# Patient Record
Sex: Female | Born: 1955
Health system: Southern US, Community
[De-identification: ages and names within clinical notes are randomized; demographics above are authoritative.]

## PROBLEM LIST (undated history)

## (undated) DIAGNOSIS — Z800 Family history of malignant neoplasm of digestive organs: Secondary | ICD-10-CM

## (undated) DIAGNOSIS — C801 Malignant (primary) neoplasm, unspecified: Secondary | ICD-10-CM

## (undated) DIAGNOSIS — E039 Hypothyroidism, unspecified: Secondary | ICD-10-CM

## (undated) DIAGNOSIS — G43909 Migraine, unspecified, not intractable, without status migrainosus: Secondary | ICD-10-CM

## (undated) DIAGNOSIS — K219 Gastro-esophageal reflux disease without esophagitis: Secondary | ICD-10-CM

## (undated) DIAGNOSIS — Z860100 Personal history of colon polyps, unspecified: Secondary | ICD-10-CM

## (undated) DIAGNOSIS — T7840XA Allergy, unspecified, initial encounter: Secondary | ICD-10-CM

## (undated) DIAGNOSIS — E7800 Pure hypercholesterolemia, unspecified: Secondary | ICD-10-CM

## (undated) DIAGNOSIS — K589 Irritable bowel syndrome without diarrhea: Secondary | ICD-10-CM

## (undated) HISTORY — DX: Allergy, unspecified, initial encounter: T78.40XA

## (undated) HISTORY — DX: Irritable bowel syndrome, unspecified: K58.9

## (undated) HISTORY — DX: Personal history of colonic polyps: Z86.010

## (undated) HISTORY — PX: CERVIX SURGERY: SHX593

## (undated) HISTORY — DX: Pure hypercholesterolemia, unspecified: E78.00

## (undated) HISTORY — DX: Gastro-esophageal reflux disease without esophagitis: K21.9

## (undated) HISTORY — DX: Personal history of colon polyps, unspecified: Z86.0100

## (undated) HISTORY — PX: OTHER SURGICAL HISTORY: SHX169

## (undated) HISTORY — DX: Migraine, unspecified, not intractable, without status migrainosus: G43.909

## (undated) HISTORY — DX: Malignant (primary) neoplasm, unspecified: C80.1

## (undated) HISTORY — DX: Family history of malignant neoplasm of digestive organs: Z80.0

## (undated) HISTORY — DX: Hypothyroidism, unspecified: E03.9

---

## 1998-05-02 ENCOUNTER — Other Ambulatory Visit: Admission: RE | Admit: 1998-05-02 | Discharge: 1998-05-02 | Payer: Self-pay | Admitting: Obstetrics and Gynecology

## 2001-01-26 ENCOUNTER — Encounter: Payer: Self-pay | Admitting: Obstetrics and Gynecology

## 2001-01-26 ENCOUNTER — Encounter: Admission: RE | Admit: 2001-01-26 | Discharge: 2001-01-26 | Payer: Self-pay | Admitting: Obstetrics and Gynecology

## 2002-02-07 ENCOUNTER — Encounter: Admission: RE | Admit: 2002-02-07 | Discharge: 2002-02-07 | Payer: Self-pay | Admitting: Internal Medicine

## 2002-02-07 ENCOUNTER — Encounter: Payer: Self-pay | Admitting: Obstetrics and Gynecology

## 2003-06-03 ENCOUNTER — Encounter: Admission: RE | Admit: 2003-06-03 | Discharge: 2003-06-03 | Payer: Self-pay | Admitting: Obstetrics and Gynecology

## 2003-06-03 ENCOUNTER — Encounter: Payer: Self-pay | Admitting: Obstetrics and Gynecology

## 2005-01-04 ENCOUNTER — Encounter: Admission: RE | Admit: 2005-01-04 | Discharge: 2005-01-04 | Payer: Self-pay | Admitting: Obstetrics and Gynecology

## 2006-01-05 ENCOUNTER — Encounter: Admission: RE | Admit: 2006-01-05 | Discharge: 2006-01-05 | Payer: Self-pay | Admitting: Obstetrics and Gynecology

## 2006-10-31 ENCOUNTER — Ambulatory Visit: Payer: Self-pay | Admitting: Vascular Surgery

## 2007-01-03 ENCOUNTER — Ambulatory Visit: Payer: Self-pay | Admitting: Vascular Surgery

## 2007-01-09 ENCOUNTER — Ambulatory Visit: Payer: Self-pay | Admitting: Vascular Surgery

## 2007-01-12 HISTORY — PX: BREAST BIOPSY: SHX20

## 2007-01-24 ENCOUNTER — Encounter: Admission: RE | Admit: 2007-01-24 | Discharge: 2007-01-24 | Payer: Self-pay | Admitting: Obstetrics and Gynecology

## 2007-02-01 ENCOUNTER — Encounter: Admission: RE | Admit: 2007-02-01 | Discharge: 2007-02-01 | Payer: Self-pay | Admitting: Obstetrics and Gynecology

## 2007-02-01 ENCOUNTER — Encounter (INDEPENDENT_AMBULATORY_CARE_PROVIDER_SITE_OTHER): Payer: Self-pay | Admitting: Diagnostic Radiology

## 2007-02-27 ENCOUNTER — Ambulatory Visit: Payer: Self-pay | Admitting: Vascular Surgery

## 2007-04-28 ENCOUNTER — Ambulatory Visit: Payer: Self-pay | Admitting: Vascular Surgery

## 2007-05-12 ENCOUNTER — Ambulatory Visit: Payer: Self-pay | Admitting: Vascular Surgery

## 2008-03-06 ENCOUNTER — Encounter: Admission: RE | Admit: 2008-03-06 | Discharge: 2008-03-06 | Payer: Self-pay | Admitting: Obstetrics and Gynecology

## 2009-04-08 ENCOUNTER — Encounter: Admission: RE | Admit: 2009-04-08 | Discharge: 2009-04-08 | Payer: Self-pay | Admitting: Obstetrics and Gynecology

## 2010-04-16 ENCOUNTER — Encounter: Admission: RE | Admit: 2010-04-16 | Discharge: 2010-04-16 | Payer: Self-pay | Admitting: Obstetrics and Gynecology

## 2010-05-28 ENCOUNTER — Emergency Department: Payer: Self-pay | Admitting: Emergency Medicine

## 2010-10-04 ENCOUNTER — Encounter: Payer: Self-pay | Admitting: Obstetrics and Gynecology

## 2010-12-16 ENCOUNTER — Encounter (INDEPENDENT_AMBULATORY_CARE_PROVIDER_SITE_OTHER): Payer: BC Managed Care – PPO | Admitting: Vascular Surgery

## 2010-12-16 ENCOUNTER — Encounter (INDEPENDENT_AMBULATORY_CARE_PROVIDER_SITE_OTHER): Payer: BC Managed Care – PPO

## 2010-12-16 DIAGNOSIS — M79609 Pain in unspecified limb: Secondary | ICD-10-CM

## 2010-12-16 DIAGNOSIS — I83893 Varicose veins of bilateral lower extremities with other complications: Secondary | ICD-10-CM

## 2010-12-17 NOTE — Assessment & Plan Note (Signed)
OFFICE VISIT  Jocelyn Rose, Jocelyn Rose DOB:  April 18, 1956                                       12/16/2010 CHART#:[id number]  The patient comes in today for evaluation of lower extremity discomfort. She is very pleasant 55 year old female who is well-known to me from prior laser ablation of the right great saphenous vein for venous hypertension in April 2008.  She now has several components of problems. She reports discomfort in her medial calves.  She reports this is a stinging and aching sensation.  It can be worse with prolonged standing but it can also occur at rest and lying down at night.  She also is concerned regarding progressive changes of telangiectasia in her medial ankles bilaterally.  She does not have a history of DVT, no history of significant swelling, ulceration, or hemorrhage.  PAST MEDICAL HISTORY:  Otherwise unremarkable.  She has no major medical difficulties.  She is married with 2 children.  She is an Chiropodist. She does not smoke or drink alcohol.  REVIEW OF SYSTEMS:  Positive for some weight gain and discomfort in her legs. NEUROLOGIC:  Positive for headache. MUSCULOSKELETAL:  Joint pain. Otherwise, the remainder of her review of systems is negative.  PHYSICAL EXAMINATION:  Well developed, well nourished, white female appearing stated age in no acute stress.  Blood pressure is 116/70, pulse 72, respirations 16.  HEENT is normal.  Her radial and dorsalis pedis pulses are 2+ bilaterally.  Musculoskeletal shows no major deformities or cyanosis.  Neurologic:  No focal weakness or paresthesias.  Skin:  Without ulcers or rashes.  She does have prominent telangiectasia over the medial ankles and no varicosities.  She does not have any swelling or pitting edema.  She underwent noninvasive vascular studies in our office and this did reveal complete ablation of her right great saphenous vein from the level of the knee to the  saphenofemoral junction.  She had no evidence of reflux in her deep or superficial system.  I discussed this at length with [name].  I explained that I do not see any evidence of any venous pathology, certainly nothing that could explain her symptom complex.  I did explain the potential treatment of the telangiectasia and did explain that this is a sign of more significant deeper problems.  I explained that she did have sclerotherapy for appearance concerns and she is comfortable with observation only.  She was reassured with this discussion.  She will see [place] on an as-needed basis.    Larina Earthly, M.D. Electronically Signed  TFE/MEDQ  D:  12/16/2010  T:  12/17/2010  Job:  [id number]  cc:   Burnell Blanks, MD

## 2011-01-05 NOTE — Procedures (Unsigned)
LOWER EXTREMITY VENOUS REFLUX EXAM  INDICATION:  Pain.  EXAM:  Using color-flow imaging and pulse Doppler spectral analysis, the bilateral common femoral, superficial femoral, popliteal, posterior tibial, greater and small saphenous veins are evaluated.  There is no evidence suggesting deep venous insufficiency in the bilateral lower extremities.  The right saphenofemoral junction is competent. The left saphenofemoral junction demonstrates reflux of >500 milliseconds.  The right GSV was not adequately visualized due to a history of laser ablation.  The left GSV is competent.  The bilateral proximal short saphenous veins demonstrates competency.  GSV Diameter (used if found to be incompetent only)                                           Right    Left Proximal Greater Saphenous Vein           cm       cm Proximal-to-mid-thigh                     cm       cm Mid thigh                                 cm       cm Mid-distal thigh                          cm       cm Distal thigh                              cm       cm Knee                                      cm       cm  IMPRESSION: 1. The right greater saphenous vein was not adequately visualized, as     described above. 2. The left greater saphenous vein is competent. 3. The deep venous system is competent. 4. The bilateral short saphenous veins are competent.  ___________________________________________ Larina Earthly, M.D.  CH/MEDQ  D:  12/16/2010  T:  12/16/2010  Job:  [id number]

## 2011-01-26 NOTE — Assessment & Plan Note (Signed)
OFFICE VISIT   Jocelyn Rose, Jocelyn Rose  DOB:  06-Sep-1956                                       02/27/2007  CHART#:[id number]   Here today for followup of laser ablation stab phlebectomy.  She had  very good outcome so far.  She reports that she is walking with her  daughter, visiting from [place] with no significant discomfort in  her right leg.  Her cosmetic result is also very good.  She does have 1  area in the mid medial calf where there is a reticular vein below the  skin that is concerning to her.  I imaged this with ultrasound and this  does appear to be in contact with her saphenous vein, which is patent  from her ankle to her knee.  Her saphenous vein was treated with lasers,  completely sclerosed in her thigh.  Plan to see her again in 2 months.  At that time, if she continues to have concern regarding this, we will  inject this for cosmetic improvement.  She is concerned regarding this  since she reports that this was the area where she was most concerned  regarding appearance prior to treatment of the vein for discomfort.  I  explained that if this is a concern we will treat this at her  sclerotherapy at no charge on her next visit.   Larina Earthly, M.D.  Electronically Signed   TFE/MEDQ  D:  02/27/2007  T:  02/28/2007  Job:  [id number]

## 2011-01-26 NOTE — Assessment & Plan Note (Signed)
OFFICE VISIT   TACHINA, SPOONEMORE  DOB:  06-09-1956                                       05/12/2007  CHART#:[id number]   Here today for followup of recent sclerotherapy to a small radicular  vein in her pretibial area above her ankle.  She had been concerned  regarding the appearance of this, and this has not been treated at the  time of laser and staph phlebectomy for symptomatic disease.  She is  very diligent, wearing her stocking for 1 week following the  sclerotherapy, that has had an excellent result.  Her vein  is  completely resolved.  I do not see any evidence, and neither does Ms.  Borromeo, of swelling or the vein under the skin.  She is quite pleased  with her overall result, as am I.  She will see [place] again on an as needed  basis.   Larina Earthly, M.D.  Electronically Signed   TFE/MEDQ  D:  05/12/2007  T:  05/16/2007  Job:  [id number]

## 2011-01-26 NOTE — Assessment & Plan Note (Signed)
OFFICE VISIT   ARACELY, RICKETT  DOB:  March 20, 1956                                       04/28/2007  CHART#:[id number]   Laurette Schimke presents today for follow up of laser ablation of her  right saphenous vein phlebectomy on January 03, 2007.  She has had a  particular distal right pretibial area that this seems to be of concern  for her.  She underwent some sclerotherapy with sodium tetradecyl today  with no apparent complication.  I will see her back in two weeks for  follow up of this.   Larina Earthly, M.D.  Electronically Signed   TFE/MEDQ  D:  04/28/2007  T:  05/01/2007  Job:  [id number]

## 2011-03-16 ENCOUNTER — Other Ambulatory Visit: Payer: Self-pay | Admitting: Obstetrics and Gynecology

## 2011-03-16 DIAGNOSIS — Z1231 Encounter for screening mammogram for malignant neoplasm of breast: Secondary | ICD-10-CM

## 2011-04-26 ENCOUNTER — Ambulatory Visit
Admission: RE | Admit: 2011-04-26 | Discharge: 2011-04-26 | Disposition: A | Discharge status: Home / Self Care | Payer: BC Managed Care – PPO | Source: Ambulatory Visit | Attending: Obstetrics and Gynecology | Admitting: Obstetrics and Gynecology

## 2011-04-26 DIAGNOSIS — Z1231 Encounter for screening mammogram for malignant neoplasm of breast: Secondary | ICD-10-CM

## 2011-09-14 HISTORY — PX: KNEE SURGERY: SHX244

## 2011-12-29 ENCOUNTER — Other Ambulatory Visit: Payer: Self-pay | Admitting: Vascular Surgery

## 2013-06-25 ENCOUNTER — Other Ambulatory Visit: Payer: Self-pay

## 2013-06-25 DIAGNOSIS — Z1231 Encounter for screening mammogram for malignant neoplasm of breast: Secondary | ICD-10-CM

## 2013-07-20 ENCOUNTER — Ambulatory Visit: Payer: BC Managed Care – PPO

## 2013-07-27 ENCOUNTER — Ambulatory Visit: Payer: BC Managed Care – PPO

## 2013-08-10 ENCOUNTER — Ambulatory Visit: Payer: BC Managed Care – PPO

## 2013-09-20 ENCOUNTER — Ambulatory Visit: Payer: BC Managed Care – PPO

## 2013-10-18 ENCOUNTER — Ambulatory Visit: Payer: BC Managed Care – PPO

## 2013-11-06 ENCOUNTER — Ambulatory Visit
Admission: RE | Admit: 2013-11-06 | Discharge: 2013-11-06 | Disposition: A | Discharge status: Home / Self Care | Payer: BC Managed Care – PPO | Source: Ambulatory Visit

## 2013-11-06 DIAGNOSIS — Z1231 Encounter for screening mammogram for malignant neoplasm of breast: Secondary | ICD-10-CM

## 2014-12-23 ENCOUNTER — Other Ambulatory Visit: Payer: Self-pay

## 2014-12-23 DIAGNOSIS — Z1231 Encounter for screening mammogram for malignant neoplasm of breast: Secondary | ICD-10-CM

## 2015-01-03 ENCOUNTER — Ambulatory Visit
Admission: RE | Admit: 2015-01-03 | Discharge: 2015-01-03 | Disposition: A | Discharge status: Home / Self Care | Payer: BLUE CROSS/BLUE SHIELD | Source: Ambulatory Visit

## 2015-01-03 DIAGNOSIS — Z1231 Encounter for screening mammogram for malignant neoplasm of breast: Secondary | ICD-10-CM

## 2015-11-19 ENCOUNTER — Other Ambulatory Visit: Payer: Self-pay

## 2015-11-19 HISTORY — PX: COLONOSCOPY: SHX174

## 2016-03-26 ENCOUNTER — Other Ambulatory Visit: Payer: Self-pay | Admitting: Obstetrics and Gynecology

## 2016-03-26 DIAGNOSIS — Z1231 Encounter for screening mammogram for malignant neoplasm of breast: Secondary | ICD-10-CM

## 2016-04-01 ENCOUNTER — Ambulatory Visit: Payer: BLUE CROSS/BLUE SHIELD

## 2016-07-02 ENCOUNTER — Other Ambulatory Visit: Payer: Self-pay | Admitting: Obstetrics and Gynecology

## 2016-07-02 ENCOUNTER — Ambulatory Visit
Admission: RE | Admit: 2016-07-02 | Discharge: 2016-07-02 | Disposition: A | Discharge status: Home / Self Care | Payer: 59 | Source: Ambulatory Visit | Attending: Obstetrics and Gynecology | Admitting: Obstetrics and Gynecology

## 2016-07-02 DIAGNOSIS — Z1231 Encounter for screening mammogram for malignant neoplasm of breast: Secondary | ICD-10-CM

## 2016-12-02 DIAGNOSIS — G43909 Migraine, unspecified, not intractable, without status migrainosus: Secondary | ICD-10-CM | POA: Diagnosis not present

## 2016-12-02 DIAGNOSIS — E039 Hypothyroidism, unspecified: Secondary | ICD-10-CM | POA: Diagnosis not present

## 2016-12-02 DIAGNOSIS — Z136 Encounter for screening for cardiovascular disorders: Secondary | ICD-10-CM | POA: Diagnosis not present

## 2016-12-02 DIAGNOSIS — G4700 Insomnia, unspecified: Secondary | ICD-10-CM | POA: Diagnosis not present

## 2017-06-10 DIAGNOSIS — E039 Hypothyroidism, unspecified: Secondary | ICD-10-CM | POA: Diagnosis not present

## 2017-06-10 DIAGNOSIS — G43909 Migraine, unspecified, not intractable, without status migrainosus: Secondary | ICD-10-CM | POA: Diagnosis not present

## 2017-06-10 DIAGNOSIS — Z136 Encounter for screening for cardiovascular disorders: Secondary | ICD-10-CM | POA: Diagnosis not present

## 2017-06-10 DIAGNOSIS — G4700 Insomnia, unspecified: Secondary | ICD-10-CM | POA: Diagnosis not present

## 2017-06-19 DIAGNOSIS — R05 Cough: Secondary | ICD-10-CM | POA: Diagnosis not present

## 2017-06-19 DIAGNOSIS — J019 Acute sinusitis, unspecified: Secondary | ICD-10-CM | POA: Diagnosis not present

## 2017-08-12 ENCOUNTER — Other Ambulatory Visit: Payer: Self-pay | Admitting: Obstetrics and Gynecology

## 2017-08-12 DIAGNOSIS — Z1231 Encounter for screening mammogram for malignant neoplasm of breast: Secondary | ICD-10-CM

## 2017-09-12 ENCOUNTER — Ambulatory Visit
Admission: RE | Admit: 2017-09-12 | Discharge: 2017-09-12 | Disposition: A | Discharge status: Home / Self Care | Payer: 59 | Source: Ambulatory Visit | Attending: Obstetrics and Gynecology | Admitting: Obstetrics and Gynecology

## 2017-09-12 DIAGNOSIS — Z1231 Encounter for screening mammogram for malignant neoplasm of breast: Secondary | ICD-10-CM | POA: Diagnosis not present

## 2017-10-15 DIAGNOSIS — J019 Acute sinusitis, unspecified: Secondary | ICD-10-CM | POA: Diagnosis not present

## 2017-10-15 DIAGNOSIS — H1032 Unspecified acute conjunctivitis, left eye: Secondary | ICD-10-CM | POA: Diagnosis not present

## 2018-03-14 DIAGNOSIS — E039 Hypothyroidism, unspecified: Secondary | ICD-10-CM | POA: Diagnosis not present

## 2018-03-14 DIAGNOSIS — B351 Tinea unguium: Secondary | ICD-10-CM | POA: Diagnosis not present

## 2018-05-16 DIAGNOSIS — B351 Tinea unguium: Secondary | ICD-10-CM | POA: Diagnosis not present

## 2018-09-20 DIAGNOSIS — L820 Inflamed seborrheic keratosis: Secondary | ICD-10-CM | POA: Diagnosis not present

## 2018-09-20 DIAGNOSIS — L918 Other hypertrophic disorders of the skin: Secondary | ICD-10-CM | POA: Diagnosis not present

## 2018-10-05 DIAGNOSIS — M1811 Unilateral primary osteoarthritis of first carpometacarpal joint, right hand: Secondary | ICD-10-CM | POA: Diagnosis not present

## 2018-10-30 ENCOUNTER — Other Ambulatory Visit: Payer: Self-pay | Admitting: Obstetrics and Gynecology

## 2018-10-30 DIAGNOSIS — Z1231 Encounter for screening mammogram for malignant neoplasm of breast: Secondary | ICD-10-CM

## 2018-12-08 ENCOUNTER — Ambulatory Visit: Payer: 59

## 2019-01-11 ENCOUNTER — Ambulatory Visit: Payer: 59

## 2019-02-20 ENCOUNTER — Ambulatory Visit: Payer: 59

## 2020-02-21 ENCOUNTER — Other Ambulatory Visit: Payer: Self-pay | Admitting: Obstetrics and Gynecology

## 2020-02-21 DIAGNOSIS — Z1231 Encounter for screening mammogram for malignant neoplasm of breast: Secondary | ICD-10-CM

## 2020-02-29 ENCOUNTER — Other Ambulatory Visit: Payer: Self-pay

## 2020-02-29 ENCOUNTER — Ambulatory Visit
Admission: RE | Admit: 2020-02-29 | Discharge: 2020-02-29 | Disposition: A | Discharge status: Home / Self Care | Payer: Medicare PPO | Source: Ambulatory Visit | Attending: Obstetrics and Gynecology | Admitting: Obstetrics and Gynecology

## 2020-02-29 DIAGNOSIS — Z1231 Encounter for screening mammogram for malignant neoplasm of breast: Secondary | ICD-10-CM

## 2020-03-05 ENCOUNTER — Other Ambulatory Visit: Payer: Self-pay | Admitting: Obstetrics and Gynecology

## 2020-03-05 DIAGNOSIS — R928 Other abnormal and inconclusive findings on diagnostic imaging of breast: Secondary | ICD-10-CM

## 2020-03-13 ENCOUNTER — Other Ambulatory Visit: Payer: Self-pay | Admitting: Family Medicine

## 2020-03-18 ENCOUNTER — Other Ambulatory Visit: Payer: Self-pay

## 2020-03-18 ENCOUNTER — Ambulatory Visit: Payer: No Typology Code available for payment source

## 2020-03-18 ENCOUNTER — Other Ambulatory Visit: Payer: Self-pay | Admitting: Family Medicine

## 2020-03-18 ENCOUNTER — Ambulatory Visit
Admission: RE | Admit: 2020-03-18 | Discharge: 2020-03-18 | Disposition: A | Discharge status: Home / Self Care | Payer: Medicare PPO | Source: Ambulatory Visit | Attending: Obstetrics and Gynecology | Admitting: Obstetrics and Gynecology

## 2020-03-18 DIAGNOSIS — R928 Other abnormal and inconclusive findings on diagnostic imaging of breast: Secondary | ICD-10-CM

## 2020-04-17 ENCOUNTER — Encounter: Payer: Self-pay | Admitting: Gastroenterology

## 2020-06-17 ENCOUNTER — Encounter: Payer: Self-pay | Admitting: Gastroenterology

## 2020-06-17 ENCOUNTER — Telehealth: Payer: Self-pay | Admitting: Gastroenterology

## 2020-06-17 ENCOUNTER — Ambulatory Visit: Payer: Medicare PPO | Admitting: Gastroenterology

## 2020-06-17 VITALS — BP 132/78 | HR 70 | Temp 97.3°F | Ht 64.0 in | Wt 173.2 lb

## 2020-06-17 DIAGNOSIS — Z800 Family history of malignant neoplasm of digestive organs: Secondary | ICD-10-CM

## 2020-06-17 DIAGNOSIS — K219 Gastro-esophageal reflux disease without esophagitis: Secondary | ICD-10-CM | POA: Diagnosis not present

## 2020-06-17 DIAGNOSIS — R1012 Left upper quadrant pain: Secondary | ICD-10-CM | POA: Diagnosis not present

## 2020-06-17 MED ORDER — OMEPRAZOLE 20 MG PO CPDR: 20.0000 mg | DELAYED_RELEASE_CAPSULE | Freq: Every day | ORAL | 3 refills | Status: DC

## 2020-06-17 MED ORDER — CLENPIQ 10-3.5-12 MG-GM -GM/160ML PO SOLN: 1.0000 | Freq: Once | ORAL | 0 refills | Status: AC

## 2020-06-17 MED ORDER — OMEPRAZOLE 20 MG PO CPDR
20.0000 mg | DELAYED_RELEASE_CAPSULE | Freq: Every day | ORAL | 6 refills | Status: DC
Start: 2020-06-17 — End: 2020-06-17

## 2020-06-17 NOTE — Progress Notes (Signed)
Chief Complaint: GERD/Abdo pain/family history of colon cancer  Referring Provider:  Leonides Sake, MD      ASSESSMENT AND PLAN;   #1. Refractory GERD  #2. FH colon cancer (mom at age 65)  #3. LUQ pain. ?etiology.  Plan: --Omeprazole 20mg  po qd #30, 6 refills. --Proceed with EGD/colon. Discussed risks & benefits. (Risks including rare perforation req laparotomy, bleeding after bx/polypectomy req blood transfusion, rarely missing neoplasms, risks of anesthesia/sedation). Benefits outweigh the risks. Patient agrees to proceed. All the questions were answered. Consent forms given for review. --If still with problems and the above WU is negative, proceed with CT scan abdo/pelvis. --Nonpharmacologic means of reflux control.    HPI:    Jocelyn Rose is a 64 y.o. female   C/O GERD-with constant burps 24/7, getting worse. Took omeprazole x 2 weeks. Got better. [place] x 1 year. Worse with tomato sause, onions, pizza. Has regurgitation, nocturnal symptoms.  Advised GI work-up  No diarrhea/constipation- better with probiotics.   C/O LUQ pain, mostly at night, not stabbing x pressure types x 3 months ago.  Not related to bowel habits.  Has some back pain.  No fever or chills.  No recent weight loss.  No nonsteroidals.  No sodas, chocolates, chewing gums, artificial sweeteners and candy. No NSAIDs   Past GI procedures: -Colonoscopy 11/2015:([facility]) fair preparation.  6 mm polyp s/p polypectomy, mild sigmoid diverticulosis, moderate internal hemorrhoids. Bx-neg.   SH-retired from [employer].  Husband is patient of ours. Past Medical History:  Diagnosis Date  . Family history of colon cancer   . History of colonic polyps   . Hypercholesterolemia   . Hypothyroidism   . IBS (irritable bowel syndrome)   . Migraine headache     Past Surgical History:  Procedure Laterality Date  . BREAST BIOPSY Right 01/2007  . CERVIX SURGERY    . COLONOSCOPY  11/19/2015   Colonic polyp status post  polypectomy. Mild sogmoid diverticulosis. Moderate internal hemorrhoids.  Marland Kitchen KNEE SURGERY  2013  . lower (uterine) segment Caesarean section (LSCS)     x2 1978 and 1985    Family History  Problem Relation Age of Onset  . Colon cancer Mother 40  . Esophageal cancer Neg Hx     Social History   Tobacco Use  . Smoking status: Never Smoker  . Smokeless tobacco: Never Used  Vaping Use  . Vaping Use: Never used  Substance Use Topics  . Alcohol use: Never  . Drug use: Never    Current Outpatient Medications  Medication Sig Dispense Refill  . Cholecalciferol (VITAMIN D3) 125 MCG (5000 UT) TABS Take 2 tablets by mouth daily.    . Lactobacillus Rhamnosus, GG, (CULTURELLE PO) Take 1 tablet by mouth daily.    Marland Kitchen levothyroxine (SYNTHROID) 88 MCG tablet 1 tablet every morning.    Marland Kitchen omeprazole (PRILOSEC) 20 MG capsule Take 20 mg by mouth as needed.    . progesterone (PROMETRIUM) 200 MG capsule 1 capsule every other day.    . zolmitriptan (ZOMIG) 5 MG tablet Take 1 tablet by mouth as needed.     No current facility-administered medications for this visit.    Not on File  Review of Systems:  Constitutional: Denies fever, chills, diaphoresis, appetite change and fatigue.  HEENT: Has allergies, headaches. Respiratory: Denies SOB, DOE, cough, chest tightness,  and wheezing.   Cardiovascular: Denies chest pain, palpitations and leg swelling.  Genitourinary: Denies dysuria, urgency, frequency, hematuria, flank pain and difficulty urinating.  Musculoskeletal:  Denies myalgias, back pain, joint swelling, arthralgias and gait problem.  Skin: No rash.  Neurological: Denies dizziness, seizures, syncope, weakness, light-headedness, numbness and has headaches.  Hematological: Denies adenopathy. Easy bruising, personal or family bleeding history  Psychiatric/Behavioral: No anxiety or depression     Physical Exam:    BP 132/78   Pulse 70   Temp (!) 97.3 F (36.3 C)   Ht 5\' 4"  (1.626 m)   Wt  173 lb 4 oz (78.6 kg)   BMI 29.74 kg/m  Wt Readings from Last 3 Encounters:  06/17/20 173 lb 4 oz (78.6 kg)   Constitutional:  Well-developed, in no acute distress. Psychiatric: Normal mood and affect. Behavior is normal. HEENT: Pupils normal.  Conjunctivae are normal. No scleral icterus. Neck supple.  Cardiovascular: Normal rate, regular rhythm. No edema Pulmonary/chest: Effort normal and breath sounds normal. No wheezing, rales or rhonchi. Abdominal: Soft, nondistended. Nontender. Bowel sounds active throughout. There are no masses palpable. No hepatomegaly. Rectal:  defered Neurological: Alert and oriented to person place and time. Skin: Skin is warm and dry. No rashes noted.  Data Reviewed: I have personally reviewed following labs and imaging studies    Carmell Austria, MD 06/17/2020, 10:27 AM  Cc: Leonides Sake, MD

## 2020-06-17 NOTE — Telephone Encounter (Signed)
Jocelyn Rose from [place] is requesting for the pt's Clenpiq to be changed for Suprep due to insurance coverage.  Cb [phone]

## 2020-06-17 NOTE — Patient Instructions (Addendum)
If you are age 65 or older, your body mass index should be between 23-30. Your Body mass index is 29.74 kg/m. If this is out of the aforementioned range listed, please consider follow up with your Primary Care Provider.  If you are age 44 or younger, your body mass index should be between 19-25. Your Body mass index is 29.74 kg/m. If this is out of the aformentioned range listed, please consider follow up with your Primary Care Provider.   We have sent the following medications to your pharmacy for you to pick up at your convenience: Omeprazole Clenpiq  You have been scheduled for an endoscopy and colonoscopy. Please follow the written instructions given to you at your visit today. Please pick up your prep supplies at the pharmacy within the next 1-3 days. If you use inhalers (even only as needed), please bring them with you on the day of your procedure.  Thank you,  Dr. Jackquline Denmark

## 2020-06-18 NOTE — Telephone Encounter (Signed)
I have called patient and left a message for her to return my call. Will offer patient a sample of Clenpiq since her insurance will not cover.

## 2020-06-18 NOTE — Telephone Encounter (Signed)
I have put a sample at the front desk for patient to pick up in [facility]

## 2020-06-24 ENCOUNTER — Encounter: Payer: Self-pay | Admitting: Gastroenterology

## 2020-07-03 ENCOUNTER — Other Ambulatory Visit: Payer: Self-pay | Admitting: Gastroenterology

## 2020-07-03 LAB — SARS CORONAVIRUS 2 (TAT 6-24 HRS): SARS Coronavirus 2: NEGATIVE

## 2020-07-07 ENCOUNTER — Encounter: Payer: Self-pay | Admitting: Gastroenterology

## 2020-07-07 ENCOUNTER — Other Ambulatory Visit: Payer: Self-pay

## 2020-07-07 ENCOUNTER — Ambulatory Visit (AMBULATORY_SURGERY_CENTER): Payer: Medicare PPO | Admitting: Gastroenterology

## 2020-07-07 VITALS — BP 118/64 | HR 64 | Temp 97.7°F | Resp 15 | Ht 64.0 in | Wt 173.0 lb

## 2020-07-07 DIAGNOSIS — K219 Gastro-esophageal reflux disease without esophagitis: Secondary | ICD-10-CM

## 2020-07-07 DIAGNOSIS — Z1211 Encounter for screening for malignant neoplasm of colon: Secondary | ICD-10-CM

## 2020-07-07 DIAGNOSIS — K2970 Gastritis, unspecified, without bleeding: Secondary | ICD-10-CM

## 2020-07-07 DIAGNOSIS — K449 Diaphragmatic hernia without obstruction or gangrene: Secondary | ICD-10-CM

## 2020-07-07 DIAGNOSIS — R12 Heartburn: Secondary | ICD-10-CM | POA: Diagnosis not present

## 2020-07-07 DIAGNOSIS — Z800 Family history of malignant neoplasm of digestive organs: Secondary | ICD-10-CM

## 2020-07-07 MED ORDER — SODIUM CHLORIDE 0.9 % IV SOLN: 500.0000 mL | INTRAVENOUS | Status: DC

## 2020-07-07 NOTE — Op Note (Signed)
[facility] Patient Name: Jocelyn Rose Procedure Date: 07/07/2020 3:44 PM MRN: [id number] Endoscopist: Jackquline Denmark , MD Age: 64 Referring MD:  Date of Birth: 12-21-1955 Gender: Female Account #: [id number] Procedure:                Colonoscopy Indications:              Screening patient at increased risk: Family history                            of 1st-degree relative with colorectal cancer at                            age 82 years (or older) Medicines:                Monitored Anesthesia Care Procedure:                Pre-Anesthesia Assessment:                           - Prior to the procedure, a History and Physical                            was performed, and patient medications and                            allergies were reviewed. The patient's tolerance of                            previous anesthesia was also reviewed. The risks                            and benefits of the procedure and the sedation                            options and risks were discussed with the patient.                            All questions were answered, and informed consent                            was obtained. Prior Anticoagulants: The patient has                            taken no previous anticoagulant or antiplatelet                            agents. ASA Grade Assessment: II - A patient with                            mild systemic disease. After reviewing the risks                            and benefits, the patient was deemed in  satisfactory condition to undergo the procedure.                           After obtaining informed consent, the colonoscope                            was passed under direct vision. Throughout the                            procedure, the patient's blood pressure, pulse, and                            oxygen saturations were monitored continuously. The                            CF H190 Colonoscope was introduced  through the anus                            and advanced to the 2 cm into the ileum. The                            colonoscopy was performed without difficulty. The                            patient tolerated the procedure well. The quality                            of the bowel preparation was good. The terminal                            ileum, ileocecal valve, appendiceal orifice, and                            rectum were photographed. Scope In: 3:45:12 PM Scope Out: 3:57:57 PM Scope Withdrawal Time: 0 hours 8 minutes 29 seconds  Total Procedure Duration: 0 hours 12 minutes 45 seconds  Findings:                 A few small-mouthed diverticula were found in the                            sigmoid colon.                           Non-bleeding internal hemorrhoids were found during                            retroflexion. The hemorrhoids were moderate.                           The terminal ileum appeared normal.                           The exam was otherwise without abnormality on  direct and retroflexion views. Complications:            No immediate complications. Estimated Blood Loss:     Estimated blood loss: none. Impression:               -Mild sigmoid diverticulosis.                           -Non-bleeding internal hemorrhoids.                           -Otherwise normal colonoscopy to TI. The colon was                            highly redundant.                           -No specimens collected. Recommendation:           - Patient has a contact number available for                            emergencies. The signs and symptoms of potential                            delayed complications were discussed with the                            patient. Return to normal activities tomorrow.                            Written discharge instructions were provided to the                            patient.                           - Resume previous  diet.                           - Continue present medications.                           - Await pathology results.                           - Repeat colonoscopy in 5 years for screening                            purposes d/t FH of colon cancer.                           - Return to GI clinic PRN.                           - The findings and recommendations were discussed  with the patient's family. Jackquline Denmark, MD 07/07/2020 4:05:40 PM This report has been signed electronically.

## 2020-07-07 NOTE — Patient Instructions (Signed)
YOU HAD AN ENDOSCOPIC PROCEDURE TODAY AT THE [facility]:   Refer to the procedure report that was given to you for any specific questions about what was found during the examination.  If the procedure report does not answer your questions, please call your gastroenterologist to clarify.  If you requested that your care partner not be given the details of your procedure findings, then the procedure report has been included in a sealed envelope for you to review at your convenience later.  YOU SHOULD EXPECT: Some feelings of bloating in the abdomen. Passage of more gas than usual.  Walking can help get rid of the air that was put into your GI tract during the procedure and reduce the bloating. If you had a lower endoscopy (such as a colonoscopy or flexible sigmoidoscopy) you may notice spotting of blood in your stool or on the toilet paper. If you underwent a bowel prep for your procedure, you may not have a normal bowel movement for a few days.  Please Note:  You might notice some irritation and congestion in your nose or some drainage.  This is from the oxygen used during your procedure.  There is no need for concern and it should clear up in a day or so.  SYMPTOMS TO REPORT IMMEDIATELY:   Following lower endoscopy (colonoscopy or flexible sigmoidoscopy):  Excessive amounts of blood in the stool  Significant tenderness or worsening of abdominal pains  Swelling of the abdomen that is new, acute  Fever of 100F or higher   Following upper endoscopy (EGD)  Vomiting of blood or coffee ground material  New chest pain or pain under the shoulder blades  Painful or persistently difficult swallowing  New shortness of breath  Fever of 100F or higher  Black, tarry-looking stools  For urgent or emergent issues, a gastroenterologist can be reached at any hour by calling [phone]. Do not use MyChart messaging for urgent concerns.    DIET:  We do recommend a small meal at first, but  then you may proceed to your regular diet.  Drink plenty of fluids but you should avoid alcoholic beverages for 24 hours.  ACTIVITY:  You should plan to take it easy for the rest of today and you should NOT DRIVE or use heavy machinery until tomorrow (because of the sedation medicines used during the test).    FOLLOW UP: Our staff will call the number listed on your records 48-72 hours following your procedure to check on you and address any questions or concerns that you may have regarding the information given to you following your procedure. If we do not reach you, we will leave a message.  We will attempt to reach you two times.  During this call, we will ask if you have developed any symptoms of COVID 19. If you develop any symptoms (ie: fever, flu-like symptoms, shortness of breath, cough etc.) before then, please call [phone].  If you test positive for Covid 19 in the 2 weeks post procedure, please call and report this information to us.    If any biopsies were taken you will be contacted by phone or by letter within the next 1-3 weeks.  Please call us at [phone] if you have not heard about the biopsies in 3 weeks.    SIGNATURES/CONFIDENTIALITY: You and/or your care partner have signed paperwork which will be entered into your electronic medical record.  These signatures attest to the fact that that the information above on   your After Visit Summary has been reviewed and is understood.  Full responsibility of the confidentiality of this discharge information lies with you and/or your care-partner. 

## 2020-07-07 NOTE — Progress Notes (Signed)
A/ox3, pleased with MAC, report to RN 

## 2020-07-07 NOTE — Progress Notes (Signed)
Called to room to assist during endoscopic procedure.  Patient ID and intended procedure confirmed with present staff. Received instructions for my participation in the procedure from the performing physician.  

## 2020-07-07 NOTE — Progress Notes (Signed)
AL[facility] - Check-in  CW- VS

## 2020-07-07 NOTE — Op Note (Signed)
[place] Patient Name: Jocelyn Rose Procedure Date: 07/07/2020 3:14 PM MRN: [id number] Endoscopist: Jackquline Denmark , MD Age: 64 Referring MD:  Date of Birth: 11-06-1955 Gender: Female Account #: [id number] Procedure:                Upper GI endoscopy Indications:              Heartburn Medicines:                Monitored Anesthesia Care Procedure:                Pre-Anesthesia Assessment:                           - Prior to the procedure, a History and Physical                            was performed, and patient medications and                            allergies were reviewed. The patient's tolerance of                            previous anesthesia was also reviewed. The risks                            and benefits of the procedure and the sedation                            options and risks were discussed with the patient.                            All questions were answered, and informed consent                            was obtained. Prior Anticoagulants: The patient has                            taken no previous anticoagulant or antiplatelet                            agents. ASA Grade Assessment: II - A patient with                            mild systemic disease. After reviewing the risks                            and benefits, the patient was deemed in                            satisfactory condition to undergo the procedure.                           After obtaining informed consent, the endoscope was  passed under direct vision. Throughout the                            procedure, the patient's blood pressure, pulse, and                            oxygen saturations were monitored continuously. The                            Endoscope was introduced through the mouth, and                            advanced to the second part of duodenum. The upper                            GI endoscopy was accomplished without  difficulty.                            The patient tolerated the procedure well. Scope In: Scope Out: Findings:                 The Z-line was irregular and was found 35 cm from                            the incisors. Biopsies were taken with a cold                            forceps for histology from all 4 quadrants.                           A small hiatal hernia was present.                           Patchy mildly erythematous mucosa without bleeding                            was found in the gastric antrum. Biopsies were                            taken with a cold forceps for histology.                           The examined duodenum was normal. Biopsies for                            histology were taken with a cold forceps for                            evaluation of celiac disease. Complications:            No immediate complications. Estimated Blood Loss:     Estimated blood loss: none. Impression:               - Z-line irregular, 35 cm from the incisors.  Biopsied.                           - Small hiatal hernia.                           - Mild gastritis. Recommendation:           - Patient has a contact number available for                            emergencies. The signs and symptoms of potential                            delayed complications were discussed with the                            patient. Return to normal activities tomorrow.                            Written discharge instructions were provided to the                            patient.                           - Resume previous diet.                           - Continue present medications including omeprazole                            20 mg p.o. once a day.                           - Nonpharmacologic means of reflux control.                           - Await pathology results.                           - The findings and recommendations were discussed                             with the patient's family. Jackquline Denmark, MD 07/07/2020 4:02:29 PM This report has been signed electronically.

## 2020-07-09 ENCOUNTER — Telehealth: Payer: Self-pay | Admitting: *Deleted

## 2020-07-09 NOTE — Telephone Encounter (Signed)
No answer, left vm 

## 2020-07-09 NOTE — Telephone Encounter (Signed)
  Follow up Call-  Call back number 07/07/2020  Post procedure Call Back phone  # [phone] cell  Permission to leave phone message Yes  Some recent data might be hidden     Patient questions:  Do you have a fever, pain , or abdominal swelling? No. Pain Score  0 *  Have you tolerated food without any problems? Yes.    Have you been able to return to your normal activities? Yes.    Do you have any questions about your discharge instructions: Diet   No. Medications  No. Follow up visit  No.  Do you have questions or concerns about your Care? No.  Actions: * If pain score is 4 or above: No action needed, pain <4.  1. Have you developed a fever since your procedure? no  2.   Have you had an respiratory symptoms (SOB or cough) since your procedure? no  3.   Have you tested positive for COVID 19 since your procedure no  4.   Have you had any family members/close contacts diagnosed with the COVID 19 since your procedure?  no   If yes to any of these questions please route to Joylene John, RN and Joella Prince, RN

## 2020-07-12 ENCOUNTER — Encounter: Payer: Self-pay | Admitting: Gastroenterology

## 2020-07-29 ENCOUNTER — Other Ambulatory Visit: Payer: Self-pay | Admitting: Gastroenterology

## 2020-07-29 ENCOUNTER — Telehealth: Payer: Self-pay | Admitting: Gastroenterology

## 2020-07-29 DIAGNOSIS — R1032 Left lower quadrant pain: Secondary | ICD-10-CM

## 2020-07-29 NOTE — Telephone Encounter (Signed)
Spoke to patient who had a continues to complain of intermittent  left side abdominal pain. She had a EGD/Colon 07/07/20 with a negative work up. Per Dr Steve Rattler office visit note 06/17/20 a CT has been scheduled at [facility]. She will come by the office for the contrast and labs. The instructions have been sent through [place]. All questions answered,patient voiced understanding.

## 2020-07-29 NOTE — Telephone Encounter (Signed)
Thanks for letting me know Lets wait for the CT AP RG

## 2020-07-29 NOTE — Telephone Encounter (Signed)
Pt would like to schedule a Ct scan. Pt states that Dr. Lyndel Safe recommended it if colonoscopy was normal. Pls call her.

## 2020-08-02 LAB — BUN+CREAT
BUN/Creatinine Ratio: 20 (ref 12–28)
BUN: 18 mg/dL (ref 8–27)
Creatinine, Ser: 0.92 mg/dL (ref 0.57–1.00)
GFR calc Af Amer: 76 mL/min/{1.73_m2} (ref >59)
GFR calc non Af Amer: 66 mL/min/{1.73_m2} (ref >59)

## 2020-08-04 ENCOUNTER — Ambulatory Visit (HOSPITAL_BASED_OUTPATIENT_CLINIC_OR_DEPARTMENT_OTHER)
Admission: RE | Admit: 2020-08-04 | Discharge: 2020-08-04 | Disposition: A | Discharge status: Home / Self Care | Payer: Medicare PPO | Source: Ambulatory Visit | Attending: Gastroenterology | Admitting: Gastroenterology

## 2020-08-04 ENCOUNTER — Other Ambulatory Visit: Payer: Self-pay

## 2020-08-04 ENCOUNTER — Encounter (HOSPITAL_BASED_OUTPATIENT_CLINIC_OR_DEPARTMENT_OTHER): Payer: Self-pay

## 2020-08-04 DIAGNOSIS — R1032 Left lower quadrant pain: Secondary | ICD-10-CM | POA: Diagnosis present

## 2020-08-04 MED ORDER — IOHEXOL 300 MG/ML  SOLN
100.0000 mL | Freq: Once | INTRAMUSCULAR | Status: AC | PRN
  Administered 2020-08-04: 100 mL via INTRAVENOUS

## 2020-11-03 ENCOUNTER — Emergency Department (HOSPITAL_COMMUNITY)
Admission: EM | Admit: 2020-11-03 | Discharge: 2020-11-03 | Disposition: A | Discharge status: Home / Self Care | Payer: Medicare PPO | Attending: Emergency Medicine | Admitting: Emergency Medicine

## 2020-11-03 ENCOUNTER — Emergency Department (HOSPITAL_COMMUNITY): Payer: Medicare PPO

## 2020-11-03 ENCOUNTER — Encounter (HOSPITAL_COMMUNITY): Payer: Self-pay | Admitting: Emergency Medicine

## 2020-11-03 DIAGNOSIS — Z8541 Personal history of malignant neoplasm of cervix uteri: Secondary | ICD-10-CM | POA: Diagnosis not present

## 2020-11-03 DIAGNOSIS — R0602 Shortness of breath: Secondary | ICD-10-CM | POA: Insufficient documentation

## 2020-11-03 DIAGNOSIS — U071 COVID-19: Secondary | ICD-10-CM

## 2020-11-03 DIAGNOSIS — R0789 Other chest pain: Secondary | ICD-10-CM | POA: Diagnosis not present

## 2020-11-03 DIAGNOSIS — Z79899 Other long term (current) drug therapy: Secondary | ICD-10-CM | POA: Insufficient documentation

## 2020-11-03 DIAGNOSIS — E039 Hypothyroidism, unspecified: Secondary | ICD-10-CM | POA: Insufficient documentation

## 2020-11-03 DIAGNOSIS — J0100 Acute maxillary sinusitis, unspecified: Secondary | ICD-10-CM

## 2020-11-03 DIAGNOSIS — R112 Nausea with vomiting, unspecified: Secondary | ICD-10-CM | POA: Diagnosis not present

## 2020-11-03 DIAGNOSIS — R110 Nausea: Secondary | ICD-10-CM

## 2020-11-03 LAB — CBC
HCT: 46.5 % — ABNORMAL HIGH (ref 36.0–46.0)
Hemoglobin: 15.2 g/dL — ABNORMAL HIGH (ref 12.0–15.0)
MCH: 30.9 pg (ref 26.0–34.0)
MCHC: 32.7 g/dL (ref 30.0–36.0)
MCV: 94.5 fL (ref 80.0–100.0)
Platelets: 192 10*3/uL (ref 150–400)
RBC: 4.92 MIL/uL (ref 3.87–5.11)
RDW: 12.8 % (ref 11.5–15.5)
WBC: 5.7 10*3/uL (ref 4.0–10.5)
nRBC: 0 % (ref 0.0–0.2)

## 2020-11-03 LAB — TROPONIN I (HIGH SENSITIVITY)
Troponin I (High Sensitivity): 5 ng/L (ref <18)
Troponin I (High Sensitivity): 5 ng/L (ref <18)

## 2020-11-03 LAB — URINALYSIS, ROUTINE W REFLEX MICROSCOPIC
Bilirubin Urine: NEGATIVE
Glucose, UA: NEGATIVE mg/dL
Hgb urine dipstick: NEGATIVE
Ketones, ur: 20 mg/dL — AB
Leukocytes,Ua: NEGATIVE
Nitrite: NEGATIVE
Protein, ur: NEGATIVE mg/dL
Specific Gravity, Urine: 1.04 — ABNORMAL HIGH (ref 1.005–1.030)
pH: 6 (ref 5.0–8.0)

## 2020-11-03 LAB — BASIC METABOLIC PANEL
Anion gap: 13 (ref 5–15)
BUN: 14 mg/dL (ref 8–23)
CO2: 19 mmol/L — ABNORMAL LOW (ref 22–32)
Calcium: 8.7 mg/dL — ABNORMAL LOW (ref 8.9–10.3)
Chloride: 104 mmol/L (ref 98–111)
Creatinine, Ser: 0.79 mg/dL (ref 0.44–1.00)
GFR, Estimated: >60 mL/min (ref >60)
Glucose, Bld: 104 mg/dL — ABNORMAL HIGH (ref 70–99)
Potassium: 3.6 mmol/L (ref 3.5–5.1)
Sodium: 136 mmol/L (ref 135–145)

## 2020-11-03 LAB — HEPATIC FUNCTION PANEL
ALT: 42 U/L (ref 0–44)
AST: 44 U/L — ABNORMAL HIGH (ref 15–41)
Albumin: 3.7 g/dL (ref 3.5–5.0)
Alkaline Phosphatase: 95 U/L (ref 38–126)
Bilirubin, Direct: 0.1 mg/dL (ref 0.0–0.2)
Indirect Bilirubin: 0.9 mg/dL (ref 0.3–0.9)
Total Bilirubin: 1 mg/dL (ref 0.3–1.2)
Total Protein: 7.7 g/dL (ref 6.5–8.1)

## 2020-11-03 LAB — LIPASE, BLOOD: Lipase: 31 U/L (ref 11–51)

## 2020-11-03 MED ORDER — ONDANSETRON HCL 4 MG/2ML IJ SOLN
4.0000 mg | Freq: Once | INTRAMUSCULAR | Status: AC
  Administered 2020-11-03: 4 mg via INTRAVENOUS
  Filled 2020-11-03: qty 2

## 2020-11-03 MED ORDER — DOXYCYCLINE HYCLATE 100 MG PO CAPS: 100.0000 mg | ORAL_CAPSULE | Freq: Two times a day (BID) | ORAL | 0 refills | Status: AC

## 2020-11-03 MED ORDER — IOHEXOL 350 MG/ML SOLN
80.0000 mL | Freq: Once | INTRAVENOUS | Status: AC | PRN
  Administered 2020-11-03: 80 mL via INTRAVENOUS

## 2020-11-03 MED ORDER — ONDANSETRON 4 MG PO TBDP: 4.0000 mg | ORAL_TABLET | Freq: Three times a day (TID) | ORAL | 0 refills | Status: AC | PRN

## 2020-11-03 MED ORDER — SODIUM CHLORIDE 0.9 % IV BOLUS
1000.0000 mL | Freq: Once | INTRAVENOUS | Status: AC
  Administered 2020-11-03: 1000 mL via INTRAVENOUS

## 2020-11-03 NOTE — ED Provider Notes (Signed)
[facility] EMERGENCY DEPARTMENT Provider Note   CSN: [id number] Arrival date & time: 11/03/20  0941     History Chief Complaint  Patient presents with  . Covid Positive    Cp,dizzy,nausea     Jocelyn Rose is a 65 y.o. female with PMHx GERD, Hypothyroidism, IBS, hypercholesterolemia, and migraines who presents to the ED today with multiple complaints.   Pt reports that 15 days ago she began having cold like symptoms including nasal congestion and sinus pressure. 3-4 days later the symptoms had not gone away despite taking OTC Mucinex DM and Sudafed. She went to an UC in [place], [place] and tested positive for COVID with an antigen test. Pt is unvaccinated. She called a local pharmacy to discuss monoclonal antibodies however was told she did not sound like a candidate. Pt states that since then she has had persistent worsening symptoms. She continues to complain of severe sinus pressure, nasal congestion/rhinorrhea, and post nasal drip without improvement in medications. She states that she had a temperature of 100.0 at [facility] however has been checking her temperature daily since then without fevers.   Pt also complains of persistent nausea for 15 days with new onset NBNB emesis yesterday. She reports she has felt lightheaded upon standing that has seemed to get worse. She has also had mild diarrhea.   Pt also mentions that she is now having left sided chest pain with radiation into the lateral aspect of her chest wall/rib area. She has not taken anything specifically for pain. She reports pain is worse with deep inspiration - she has not noticed if it is worse with exertion as she has felt too lightheaded to walk around a lot. No hemoptysis. No hx DVT/PE. Pt is on progesterone capsul everyday; no estrogen therapy. No active malignancy. Pt is a never smoker. Denies FHX CAD.   The history is provided by the patient and medical records.       Past Medical History:  Diagnosis  Date  . Allergy   . Cancer ([place])    cervical cancer at age - 69  . Family history of colon cancer   . GERD (gastroesophageal reflux disease)   . History of colonic polyps   . Hypercholesterolemia   . Hypothyroidism   . IBS (irritable bowel syndrome)   . Migraine headache     There are no problems to display for this patient.   Past Surgical History:  Procedure Laterality Date  . BREAST BIOPSY Right 01/2007  . CERVIX SURGERY    . COLONOSCOPY  11/19/2015   Colonic polyp status post polypectomy. Mild sogmoid diverticulosis. Moderate internal hemorrhoids.  Marland Kitchen KNEE SURGERY  2013  . lower (uterine) segment Caesarean section (LSCS)     x2 1978 and 1985     OB History   No obstetric history on file.     Family History  Problem Relation Age of Onset  . Colon cancer Mother 63  . Esophageal cancer Neg Hx   . Rectal cancer Neg Hx   . Stomach cancer Neg Hx     Social History   Tobacco Use  . Smoking status: Never Smoker  . Smokeless tobacco: Never Used  Vaping Use  . Vaping Use: Never used  Substance Use Topics  . Alcohol use: Never  . Drug use: Never    Home Medications Prior to Admission medications   Medication Sig Start Date End Date Taking? Authorizing Provider  Cholecalciferol (VITAMIN D3) 125 MCG (5000 UT) TABS  Take 2 tablets by mouth daily.   Yes [provider]  doxycycline (VIBRAMYCIN) 100 MG capsule Take 1 capsule (100 mg total) by mouth 2 (two) times daily for 7 days. 11/03/20 11/10/20 Yes Haile Bosler, PA-C  ipratropium (ATROVENT) 0.03 % nasal spray Place 2 sprays into both nostrils as needed for rhinitis. 09/01/20  Yes [provider]  Lactobacillus Rhamnosus, GG, (CULTURELLE PO) Take 1 tablet by mouth daily.   Yes [provider]  levothyroxine (SYNTHROID) 88 MCG tablet Take 88 mcg by mouth every morning. 06/10/20  Yes [provider]  naratriptan (AMERGE) 2.5 MG tablet Take 2.5 mg by mouth as needed for migraine. 09/15/20   Yes [provider]  omeprazole (PRILOSEC) 20 MG capsule Take 1 capsule (20 mg total) by mouth daily. 06/17/20  Yes Jackquline Denmark, MD  ondansetron (ZOFRAN ODT) 4 MG disintegrating tablet Take 1 tablet (4 mg total) by mouth every 8 (eight) hours as needed for nausea or vomiting. 11/03/20  Yes Alroy Bailiff, Karris Deangelo, PA-C  progesterone (PROMETRIUM) 200 MG capsule Take 200 mg by mouth every other day. 03/20/20  Yes [provider]    Allergies    Patient has no known allergies.  Review of Systems   Review of Systems  Constitutional: Positive for fatigue.  HENT: Positive for congestion, postnasal drip, rhinorrhea, sinus pressure and sinus pain.   Eyes: Negative for visual disturbance.  Respiratory: Positive for cough and shortness of breath.   Cardiovascular: Positive for chest pain. Negative for palpitations and leg swelling.  Gastrointestinal: Positive for diarrhea, nausea and vomiting. Negative for abdominal pain.  Neurological: Positive for light-headedness.  All other systems reviewed and are negative.   Physical Exam Updated Vital Signs BP (!) 139/91 (BP Location: Left Arm)   Pulse 82   Temp 97.8 F (36.6 C) (Oral)   Resp 16   LMP  (LMP Unknown)   SpO2 99%   Physical Exam Vitals and nursing note reviewed.  Constitutional:      Appearance: She is ill-appearing. She is not diaphoretic.  HENT:     Head: Normocephalic and atraumatic.     Right Ear: Tympanic membrane normal.     Left Ear: Tympanic membrane normal.     Nose: Congestion present.     Right Sinus: Maxillary sinus tenderness present.     Left Sinus: Maxillary sinus tenderness present.  Eyes:     Extraocular Movements: Extraocular movements intact.     Conjunctiva/sclera: Conjunctivae normal.     Pupils: Pupils are equal, round, and reactive to light.  Neck:     Meningeal: Brudzinski's sign and Kernig's sign absent.  Cardiovascular:     Rate and Rhythm: Normal rate and regular rhythm.     Pulses:  Normal pulses.  Pulmonary:     Effort: Pulmonary effort is normal.     Breath sounds: Normal breath sounds. No wheezing, rhonchi or rales.     Comments: Speaking in full sentences without difficulty. Satting 99% on RA. LCTAB.  Chest:     Chest wall: Tenderness present.  Abdominal:     Palpations: Abdomen is soft.     Tenderness: There is no abdominal tenderness. There is no guarding or rebound.  Musculoskeletal:     Cervical back: Neck supple. No rigidity.  Lymphadenopathy:     Cervical: No cervical adenopathy.  Skin:    General: Skin is warm and dry.  Neurological:     Mental Status: She is alert.     Comments: Alert and  oriented to self, place, time and event.   Speech is fluent, clear without dysarthria or dysphasia.   Strength 5/5 in upper/lower extremities  Sensation intact in upper/lower extremities   Normal gait.  Negative Romberg. No pronator drift.  Normal finger-to-nose and feet tapping.  CN I not tested  CN II grossly intact visual fields bilaterally. Did not visualize posterior eye.   CN III, IV, VI PERRLA and EOMs intact bilaterally  CN V Intact sensation to sharp and light touch to the face  CN VII facial movements symmetric  CN VIII not tested  CN IX, X no uvula deviation, symmetric rise of soft palate  CN XI 5/5 SCM and trapezius strength bilaterally  CN XII Midline tongue protrusion, symmetric L/R movements      ED Results / Procedures / Treatments   Labs (all labs ordered are listed, but only abnormal results are displayed) Labs Reviewed  BASIC METABOLIC PANEL - Abnormal; Notable for the following components:      Result Value   CO2 19 (*)    Glucose, Bld 104 (*)    Calcium 8.7 (*)    All other components within normal limits  CBC - Abnormal; Notable for the following components:   Hemoglobin 15.2 (*)    HCT 46.5 (*)    All other components within normal limits  HEPATIC FUNCTION PANEL - Abnormal; Notable for the following components:   AST 44  (*)    All other components within normal limits  URINALYSIS, ROUTINE W REFLEX MICROSCOPIC - Abnormal; Notable for the following components:   Specific Gravity, Urine 1.040 (*)    Ketones, ur 20 (*)    All other components within normal limits  LIPASE, BLOOD  TROPONIN I (HIGH SENSITIVITY)  TROPONIN I (HIGH SENSITIVITY)    EKG None  Radiology DG Chest 2 View  Result Date: 11/03/2020 CLINICAL DATA:  Shortness of breath. EXAM: CHEST - 2 VIEW COMPARISON:  None. FINDINGS: The heart size and mediastinal contours are within normal limits. Both lungs are clear. No pneumothorax or pleural effusion is noted. The visualized skeletal structures are unremarkable. IMPRESSION: No active cardiopulmonary disease. Electronically Signed   By: Marijo Conception M.D.   On: 11/03/2020 10:17   CT Angio Chest PE W/Cm &/Or Wo Cm  Result Date: 11/03/2020 CLINICAL DATA:  Dizziness with nausea and vomiting. Left-sided chest pain for 2-3 days. COVID positive EXAM: CT ANGIOGRAPHY CHEST WITH CONTRAST TECHNIQUE: Multidetector CT imaging of the chest was performed using the standard protocol during bolus administration of intravenous contrast. Multiplanar CT image reconstructions and MIPs were obtained to evaluate the vascular anatomy. CONTRAST:  83mL OMNIPAQUE IOHEXOL 350 MG/ML SOLN COMPARISON:  None. FINDINGS: Cardiovascular: Satisfactory opacification of the pulmonary arteries to the segmental level. No evidence of pulmonary embolism. Normal heart size. No pericardial effusion. Mediastinum/Nodes: Negative for adenopathy or mass. Lungs/Pleura: Central airways are clear. Patchy airspace opacity with ground-glass appearance in the bilateral lungs. No edema, effusion, or air leak Upper Abdomen: Negative Musculoskeletal: Negative Review of the MIP images confirms the above findings. IMPRESSION: 1. Negative for pulmonary embolism. 2. Patchy pneumonia compatible with history of recent COVID infection. Electronically Signed   By:  Monte Fantasia M.D.   On: 11/03/2020 11:36    Procedures Procedures   Medications Ordered in ED Medications  iohexol (OMNIPAQUE) 350 MG/ML injection 80 mL (80 mLs Intravenous Contrast Given 11/03/20 1122)  ondansetron (ZOFRAN) injection 4 mg (4 mg Intravenous Given 11/03/20 1242)  sodium chloride 0.9 %  bolus 1,000 mL (0 mLs Intravenous Stopped 11/03/20 1329)    ED Course  I have reviewed the triage vital signs and the nursing notes.  Pertinent labs & imaging results that were available during my care of the patient were reviewed by me and considered in my medical decision making (see chart for details).    MDM Rules/Calculators/A&P                          65 year old female presents to the ED today with continued symptoms for the past 15 days with positive Covid status, unvaccinated.  Has not had any relief with over-the-counter medications and is here due to the fact that her symptoms have persisted and are getting worse including severe sinus pressure.  Also complaining of new onset left-sided chest pain with radiation into the left lateral ribs, pleuritic in nature.  Started vomiting yesterday.  No history of CAD or family history.  No history of DVT/PE however patient states she has been less active recently due to her Covid infection as well as lightheadedness with activity.  She is on oral progesterone daily, no estrogen therapy.  Will to the ED vitals are stable.  Patient is afebrile, nontachycardic and nontachypneic.  She appears to be in no acute distress however is ill-appearing, appears fatigued.  She has no focal neuro deficits on exam today and no meningeal signs.  She does have bilateral maxillary sinus tenderness palpation.  She also has some chest wall tenderness palpation on the left side.  Equal pulses bilaterally. No abdominal TTP and no complaints of abdominal pain.  Given Covid infection with new onset chest pain concern for possible PE.  Will work-up at this time.  An EKG  was obtained while patient was in the waiting room without any acute ischemic changes.  X-ray was also obtained which was negative for any signs of pneumonia or other abnormalities.  We will plan to work-up for her lightheadedness with orthostatic vitals to determine if patient needs fluids.  We will check electrolytes as well.  We will plan for CTA to rule out PE.  If remainder of work-up is negative may consider antibiotics for sinus infection given maxillary sinus tenderness palpation as well as symptoms persistent for greater than 10 days.  Patient may very well have a coinfection with sinusitis as well as Covid.   CBC without leukocytosis. Hgb slightly elevated at 15.2. Orthostatics negative however suspect some dehydration causing her lightheadedness - will provide fluids at this time.  BMP with glucose 104 and bicarb 19. No gap. No other electrolyte abnormalities LFTs and lipase negative  CTA negative for PE. Findings consistent with COVID pneumonia at this time.  U/A with increased specific gravity; no signs of UTI at this time Troponin negative x 2   Pt able to tolerate fluids without additional emesis. She has ambulated in the ED without difficulty as well. Attending physician Dr. Eulis Foster has evaluated pt and agrees with discharge. Will treat for sinusitis at this time with doyxcycline x 7 days as well as zofran for nausea with instructions to follow up with post covid care clinic. Pt in agreement with plan and stable for discharge.   This note was prepared using Dragon voice recognition software and may include unintentional dictation errors due to the inherent limitations of voice recognition software.  Jocelyn Rose was evaluated in Emergency Department on 11/03/2020 for the symptoms described in the history of present illness. She was evaluated  in the context of the global COVID-19 pandemic, which necessitated consideration that the patient might be at risk for infection with the SARS-CoV-2  virus that causes COVID-19. Institutional protocols and algorithms that pertain to the evaluation of patients at risk for COVID-19 are in a state of rapid change based on information released by regulatory bodies including the CDC and federal and state organizations. These policies and algorithms were followed during the patient's care in the ED.  Final Clinical Impression(s) / ED Diagnoses Final diagnoses:  COVID-19  Nausea  Non-intractable vomiting with nausea, unspecified vomiting type  Acute maxillary sinusitis, recurrence not specified  Chest wall pain    Rx / DC Orders ED Discharge Orders         Ordered    doxycycline (VIBRAMYCIN) 100 MG capsule  2 times daily        11/03/20 1406    ondansetron (ZOFRAN ODT) 4 MG disintegrating tablet  Every 8 hours PRN        11/03/20 1406           Discharge Instructions     Your workup was reassuring here in the ED today. I have prescribed an antibiotic to cover for a sinus infection given you have been having sinus pressure for > 10 days. You may have an underlying sinus infection on top of your COVID infection today.   I have also prescribed nausea medication - take as needed for nausea. Drink plenty of fluids to stay hydrated.   Take Tylenol as needed for fevers and Ibuprofen as needed for your chest wall pain.   Please follow up with the [facility] for further evaluation given your symptoms have been ongoing for > 2 weeks  Return to the ED for any worsening symptoms       Eustaquio Maize, PA-C 11/03/20 1411    Daleen Bo, MD 11/05/20 [phone]

## 2020-11-03 NOTE — ED Provider Notes (Signed)
  Face-to-face evaluation   History: She presents for evaluation of nasal congestion for 2 weeks, following Covid infection.  She denies fever or chills.  She has tried multiple over-the-counter medicines and is chronically on Flonase.  She is not having chest pain, leg swelling, focal weakness or paresthesia.  There are no other known modifying factors.  Physical exam: Alert elderly female who is comfortable.  She has nasal congestion.  No respiratory distress.  No dysarthria or aphasia  Medical screening examination/treatment/procedure(s) were conducted as a shared visit with non-physician practitioner(s) and myself.  I personally evaluated the patient during the encounter    Daleen Bo, MD 11/05/20 [phone]

## 2020-11-03 NOTE — ED Notes (Signed)
Pt ambulated in room to [facility]. Pt tolerated well and states she feels better since fluids. Pt tolerating PO challenge so far.

## 2020-11-03 NOTE — ED Triage Notes (Addendum)
Pt arrives to ED with chief complaint of nausea, vomiting & dizziness. She is on day 14 of covid was diagnosed at [facility] outside of [place].  She is having nasal congestion headache, nausea and vomiting that does not seem to being getting better. For the last 2-3 days has noticed left sided chest pain into left arm. She also has mild sob with exertion.

## 2020-11-03 NOTE — Discharge Instructions (Signed)
Your workup was reassuring here in the ED today. I have prescribed an antibiotic to cover for a sinus infection given you have been having sinus pressure for > 10 days. You may have an underlying sinus infection on top of your COVID infection today.   I have also prescribed nausea medication - take as needed for nausea. Drink plenty of fluids to stay hydrated.   Take Tylenol as needed for fevers and Ibuprofen as needed for your chest wall pain.   Please follow up with the [facility] for further evaluation given your symptoms have been ongoing for > 2 weeks  Return to the ED for any worsening symptoms

## 2020-11-10 ENCOUNTER — Ambulatory Visit: Payer: Medicare PPO

## 2021-07-14 ENCOUNTER — Other Ambulatory Visit: Payer: Self-pay | Admitting: Gastroenterology

## 2021-08-28 DIAGNOSIS — E039 Hypothyroidism, unspecified: Secondary | ICD-10-CM | POA: Diagnosis not present

## 2021-09-08 DIAGNOSIS — G43909 Migraine, unspecified, not intractable, without status migrainosus: Secondary | ICD-10-CM | POA: Diagnosis not present

## 2021-09-08 DIAGNOSIS — Z6831 Body mass index (BMI) 31.0-31.9, adult: Secondary | ICD-10-CM | POA: Diagnosis not present

## 2021-09-08 DIAGNOSIS — Z1331 Encounter for screening for depression: Secondary | ICD-10-CM | POA: Diagnosis not present

## 2021-09-08 DIAGNOSIS — Z9181 History of falling: Secondary | ICD-10-CM | POA: Diagnosis not present

## 2021-09-08 DIAGNOSIS — K219 Gastro-esophageal reflux disease without esophagitis: Secondary | ICD-10-CM | POA: Diagnosis not present

## 2021-09-08 DIAGNOSIS — E039 Hypothyroidism, unspecified: Secondary | ICD-10-CM | POA: Diagnosis not present

## 2021-09-24 ENCOUNTER — Other Ambulatory Visit: Payer: Self-pay | Admitting: Obstetrics and Gynecology

## 2021-09-24 DIAGNOSIS — Z1231 Encounter for screening mammogram for malignant neoplasm of breast: Secondary | ICD-10-CM

## 2021-10-06 ENCOUNTER — Ambulatory Visit: Payer: Medicare PPO

## 2021-10-18 ENCOUNTER — Other Ambulatory Visit: Payer: Self-pay | Admitting: Gastroenterology

## 2021-10-19 NOTE — Telephone Encounter (Signed)
The patient needs to schedule an appointment for further refills

## 2021-12-15 ENCOUNTER — Ambulatory Visit
Admission: RE | Admit: 2021-12-15 | Discharge: 2021-12-15 | Disposition: A | Discharge status: Home / Self Care | Payer: Medicare PPO | Source: Ambulatory Visit | Attending: Obstetrics and Gynecology | Admitting: Obstetrics and Gynecology

## 2021-12-15 DIAGNOSIS — Z1231 Encounter for screening mammogram for malignant neoplasm of breast: Secondary | ICD-10-CM

## 2022-01-25 DIAGNOSIS — M7582 Other shoulder lesions, left shoulder: Secondary | ICD-10-CM | POA: Diagnosis not present

## 2022-01-25 DIAGNOSIS — S139XXA Sprain of joints and ligaments of unspecified parts of neck, initial encounter: Secondary | ICD-10-CM | POA: Diagnosis not present

## 2022-05-04 DIAGNOSIS — M5412 Radiculopathy, cervical region: Secondary | ICD-10-CM | POA: Diagnosis not present

## 2022-05-05 DIAGNOSIS — S46012D Strain of muscle(s) and tendon(s) of the rotator cuff of left shoulder, subsequent encounter: Secondary | ICD-10-CM | POA: Diagnosis not present

## 2022-05-05 DIAGNOSIS — M50122 Cervical disc disorder at C5-C6 level with radiculopathy: Secondary | ICD-10-CM | POA: Diagnosis not present

## 2022-05-05 DIAGNOSIS — M50121 Cervical disc disorder at C4-C5 level with radiculopathy: Secondary | ICD-10-CM | POA: Diagnosis not present

## 2022-05-05 DIAGNOSIS — M50123 Cervical disc disorder at C6-C7 level with radiculopathy: Secondary | ICD-10-CM | POA: Diagnosis not present

## 2022-05-14 DIAGNOSIS — M50121 Cervical disc disorder at C4-C5 level with radiculopathy: Secondary | ICD-10-CM | POA: Diagnosis not present

## 2022-05-14 DIAGNOSIS — M50122 Cervical disc disorder at C5-C6 level with radiculopathy: Secondary | ICD-10-CM | POA: Diagnosis not present

## 2022-05-14 DIAGNOSIS — S46012D Strain of muscle(s) and tendon(s) of the rotator cuff of left shoulder, subsequent encounter: Secondary | ICD-10-CM | POA: Diagnosis not present

## 2022-05-14 DIAGNOSIS — M50123 Cervical disc disorder at C6-C7 level with radiculopathy: Secondary | ICD-10-CM | POA: Diagnosis not present

## 2022-05-25 DIAGNOSIS — M50123 Cervical disc disorder at C6-C7 level with radiculopathy: Secondary | ICD-10-CM | POA: Diagnosis not present

## 2022-05-25 DIAGNOSIS — M50121 Cervical disc disorder at C4-C5 level with radiculopathy: Secondary | ICD-10-CM | POA: Diagnosis not present

## 2022-05-25 DIAGNOSIS — S46012D Strain of muscle(s) and tendon(s) of the rotator cuff of left shoulder, subsequent encounter: Secondary | ICD-10-CM | POA: Diagnosis not present

## 2022-05-25 DIAGNOSIS — M50122 Cervical disc disorder at C5-C6 level with radiculopathy: Secondary | ICD-10-CM | POA: Diagnosis not present

## 2022-11-05 DIAGNOSIS — K219 Gastro-esophageal reflux disease without esophagitis: Secondary | ICD-10-CM | POA: Diagnosis not present

## 2022-11-05 DIAGNOSIS — J309 Allergic rhinitis, unspecified: Secondary | ICD-10-CM | POA: Diagnosis not present

## 2022-11-05 DIAGNOSIS — E039 Hypothyroidism, unspecified: Secondary | ICD-10-CM | POA: Diagnosis not present

## 2022-11-05 DIAGNOSIS — G43909 Migraine, unspecified, not intractable, without status migrainosus: Secondary | ICD-10-CM | POA: Diagnosis not present

## 2022-11-05 DIAGNOSIS — Z6832 Body mass index (BMI) 32.0-32.9, adult: Secondary | ICD-10-CM | POA: Diagnosis not present

## 2022-11-05 DIAGNOSIS — Z1331 Encounter for screening for depression: Secondary | ICD-10-CM | POA: Diagnosis not present

## 2022-11-05 DIAGNOSIS — Z9181 History of falling: Secondary | ICD-10-CM | POA: Diagnosis not present

## 2022-11-18 DIAGNOSIS — Z23 Encounter for immunization: Secondary | ICD-10-CM | POA: Diagnosis not present

## 2022-11-18 DIAGNOSIS — S81032A Puncture wound without foreign body, left knee, initial encounter: Secondary | ICD-10-CM | POA: Diagnosis not present

## 2022-11-18 DIAGNOSIS — W540XXA Bitten by dog, initial encounter: Secondary | ICD-10-CM | POA: Diagnosis not present

## 2022-11-18 DIAGNOSIS — E039 Hypothyroidism, unspecified: Secondary | ICD-10-CM | POA: Diagnosis not present

## 2022-12-21 ENCOUNTER — Other Ambulatory Visit: Payer: Self-pay | Admitting: Family Medicine

## 2022-12-21 DIAGNOSIS — Z1231 Encounter for screening mammogram for malignant neoplasm of breast: Secondary | ICD-10-CM

## 2023-01-27 ENCOUNTER — Ambulatory Visit
Admission: RE | Admit: 2023-01-27 | Discharge: 2023-01-27 | Disposition: A | Discharge status: Home / Self Care | Payer: Medicare PPO | Source: Ambulatory Visit | Attending: Family Medicine | Admitting: Family Medicine

## 2023-01-27 DIAGNOSIS — Z1231 Encounter for screening mammogram for malignant neoplasm of breast: Secondary | ICD-10-CM

## 2023-02-01 ENCOUNTER — Other Ambulatory Visit: Payer: Self-pay | Admitting: Family Medicine

## 2023-02-01 DIAGNOSIS — R928 Other abnormal and inconclusive findings on diagnostic imaging of breast: Secondary | ICD-10-CM

## 2023-02-11 ENCOUNTER — Other Ambulatory Visit: Payer: Self-pay | Admitting: Family Medicine

## 2023-02-11 ENCOUNTER — Ambulatory Visit
Admission: RE | Admit: 2023-02-11 | Discharge: 2023-02-11 | Disposition: A | Discharge status: Home / Self Care | Payer: Medicare PPO | Source: Ambulatory Visit | Attending: Family Medicine | Admitting: Family Medicine

## 2023-02-11 ENCOUNTER — Ambulatory Visit: Payer: Medicare PPO

## 2023-02-11 DIAGNOSIS — R922 Inconclusive mammogram: Secondary | ICD-10-CM | POA: Diagnosis not present

## 2023-02-11 DIAGNOSIS — R921 Mammographic calcification found on diagnostic imaging of breast: Secondary | ICD-10-CM

## 2023-02-11 DIAGNOSIS — R928 Other abnormal and inconclusive findings on diagnostic imaging of breast: Secondary | ICD-10-CM

## 2023-02-11 DIAGNOSIS — R92332 Mammographic heterogeneous density, left breast: Secondary | ICD-10-CM | POA: Diagnosis not present

## 2023-02-15 ENCOUNTER — Ambulatory Visit
Admission: RE | Admit: 2023-02-15 | Discharge: 2023-02-15 | Disposition: A | Discharge status: Home / Self Care | Payer: Medicare PPO | Source: Ambulatory Visit | Attending: Family Medicine | Admitting: Family Medicine

## 2023-02-15 DIAGNOSIS — R921 Mammographic calcification found on diagnostic imaging of breast: Secondary | ICD-10-CM

## 2023-02-15 DIAGNOSIS — N6012 Diffuse cystic mastopathy of left breast: Secondary | ICD-10-CM | POA: Diagnosis not present

## 2023-02-15 DIAGNOSIS — R928 Other abnormal and inconclusive findings on diagnostic imaging of breast: Secondary | ICD-10-CM

## 2023-02-15 DIAGNOSIS — N6022 Fibroadenosis of left breast: Secondary | ICD-10-CM | POA: Diagnosis not present

## 2023-02-15 DIAGNOSIS — N6042 Mammary duct ectasia of left breast: Secondary | ICD-10-CM | POA: Diagnosis not present

## 2023-02-15 HISTORY — PX: BREAST BIOPSY: SHX20

## 2023-02-16 ENCOUNTER — Other Ambulatory Visit: Payer: Self-pay | Admitting: Family Medicine

## 2023-02-16 DIAGNOSIS — R921 Mammographic calcification found on diagnostic imaging of breast: Secondary | ICD-10-CM

## 2023-08-17 ENCOUNTER — Ambulatory Visit
Admission: RE | Admit: 2023-08-17 | Discharge: 2023-08-17 | Disposition: A | Discharge status: Home / Self Care | Payer: Medicare PPO | Source: Ambulatory Visit | Attending: Family Medicine | Admitting: Family Medicine

## 2023-08-17 ENCOUNTER — Ambulatory Visit: Payer: Medicare PPO

## 2023-08-17 DIAGNOSIS — R921 Mammographic calcification found on diagnostic imaging of breast: Secondary | ICD-10-CM

## 2023-08-18 IMAGING — MG MM DIGITAL SCREENING BILAT W/ TOMO AND CAD
8 series · 8 of 24 positions shown · non-contrast
Comparison: Previous exam(s).

CLINICAL DATA: Screening.

EXAM:
DIGITAL SCREENING BILATERAL MAMMOGRAM WITH TOMOSYNTHESIS AND CAD
TECHNIQUE: Bilateral screening digital craniocaudal and mediolateral oblique
mammograms were obtained. Bilateral screening digital breast
tomosynthesis was performed. The images were evaluated with
computer-aided detection.

[R CC synth-2D]
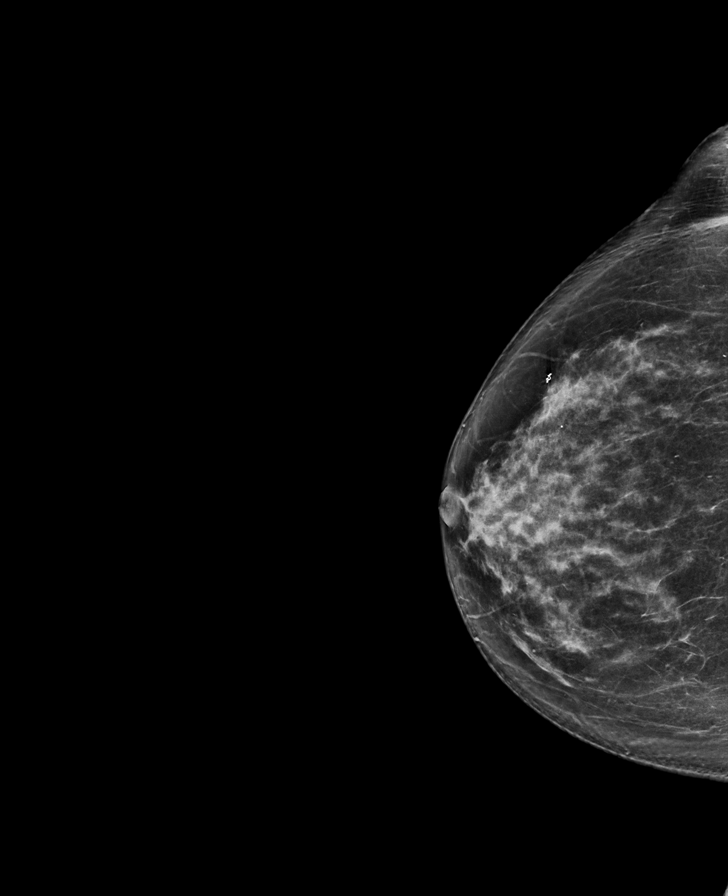

[L CC synth-2D]
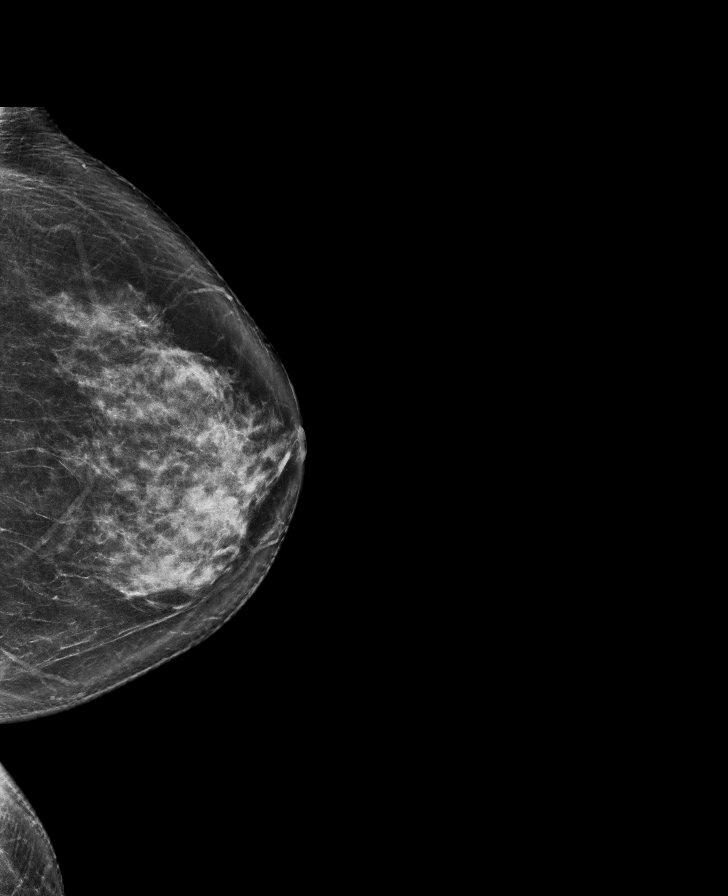

[L MLO synth-2D]
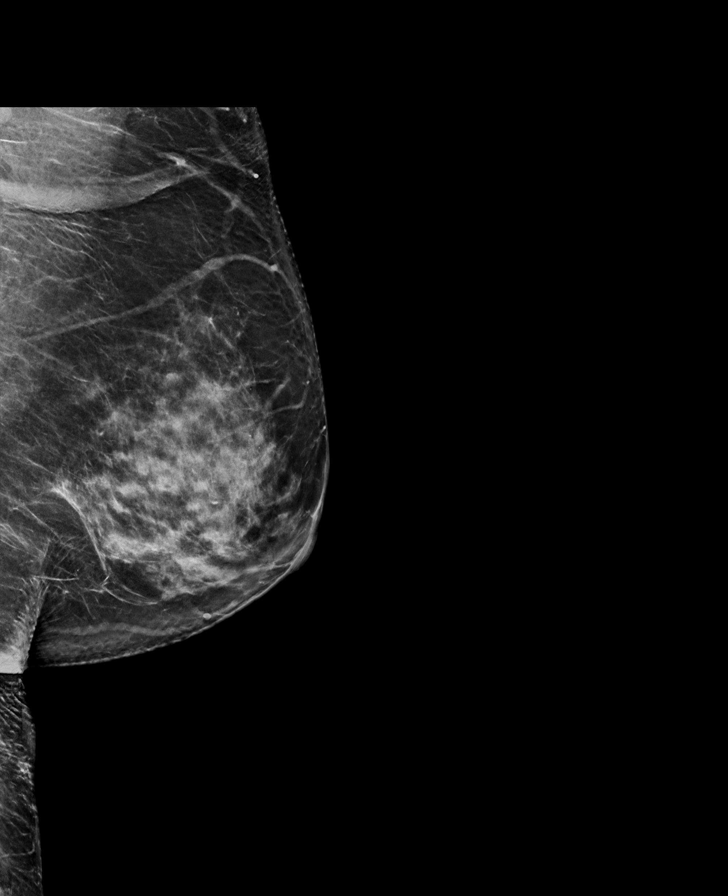

[R MLO synth-2D]
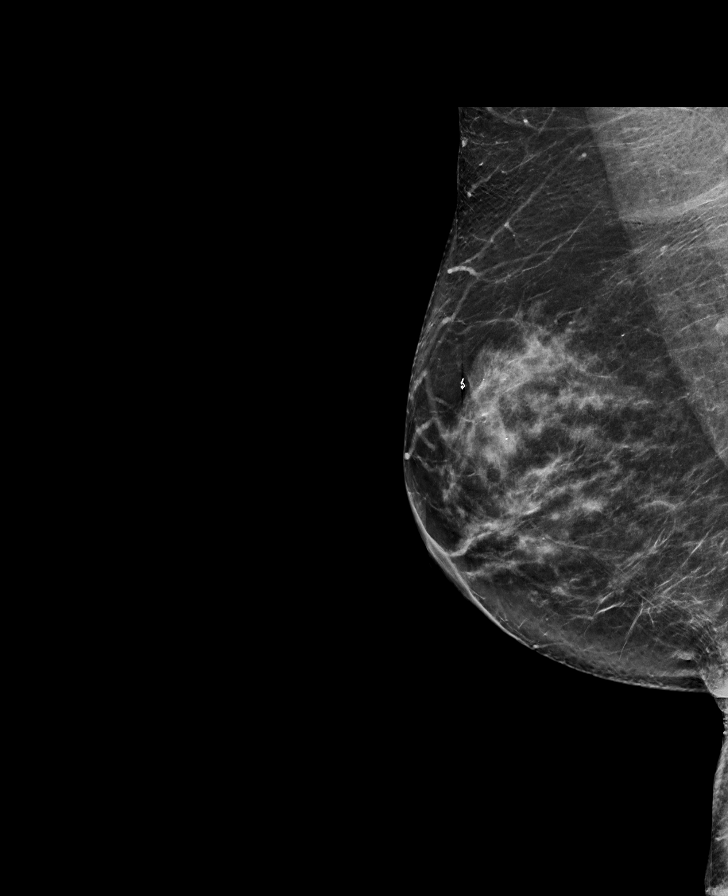

[L MLO tomo · tomo slice 43/86.0]
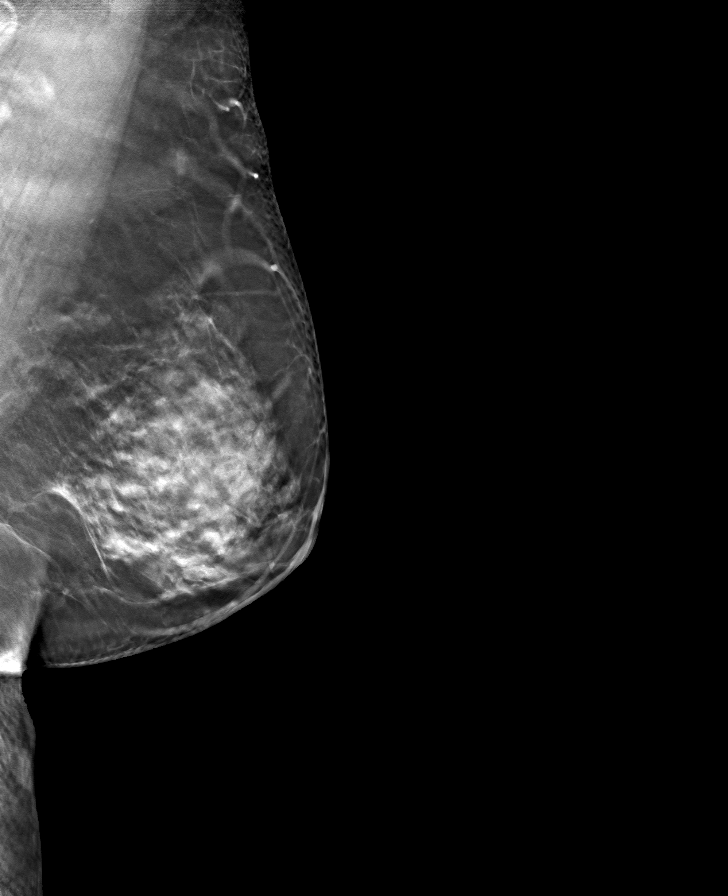

[R MLO tomo · tomo slice 42/83.0]
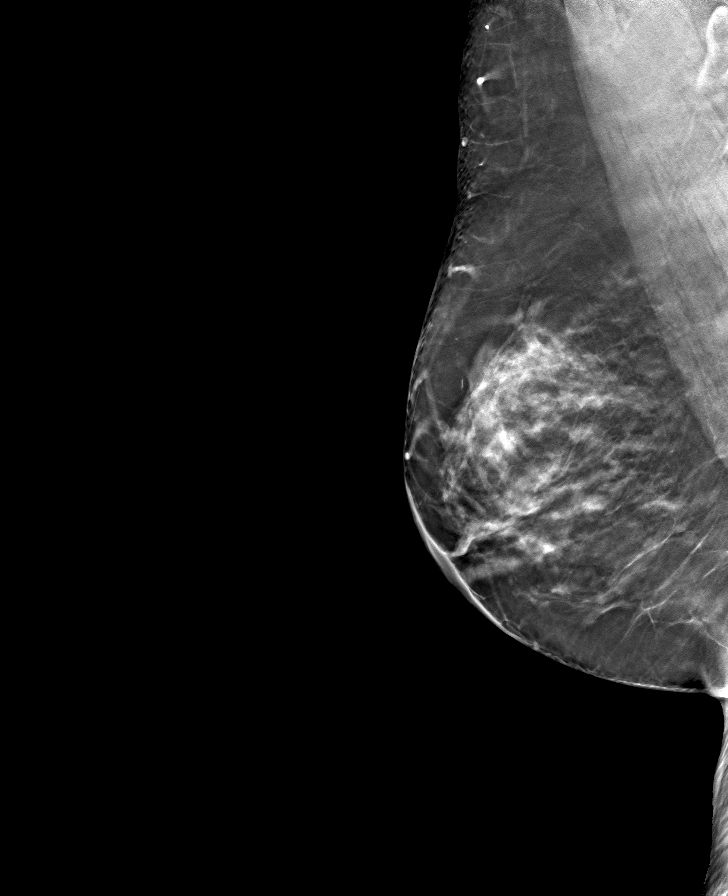

[R CC tomo · tomo slice 41/82.0]
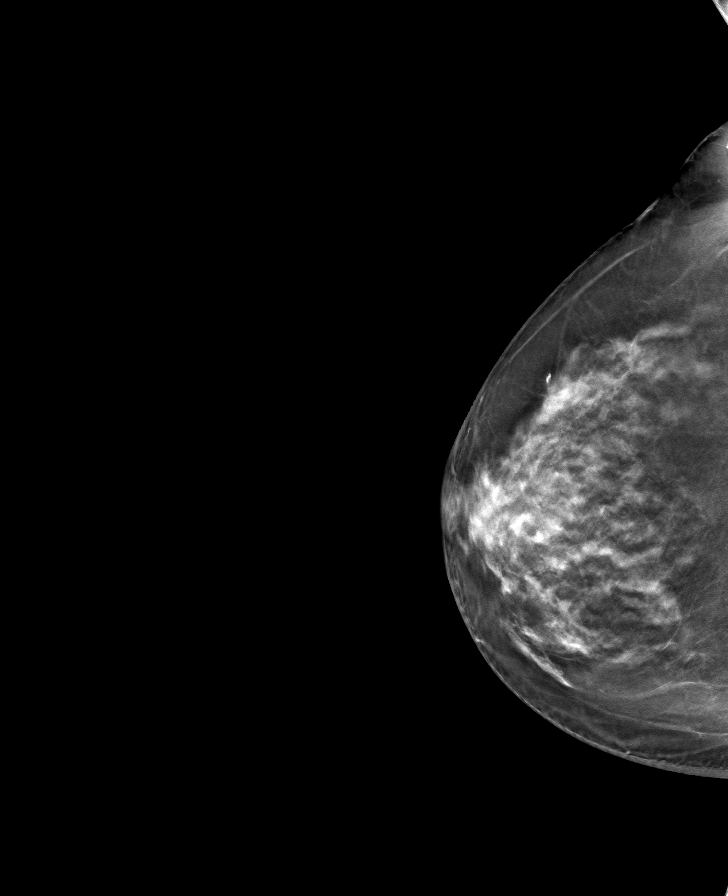

[L CC tomo · tomo slice 43/84.0]
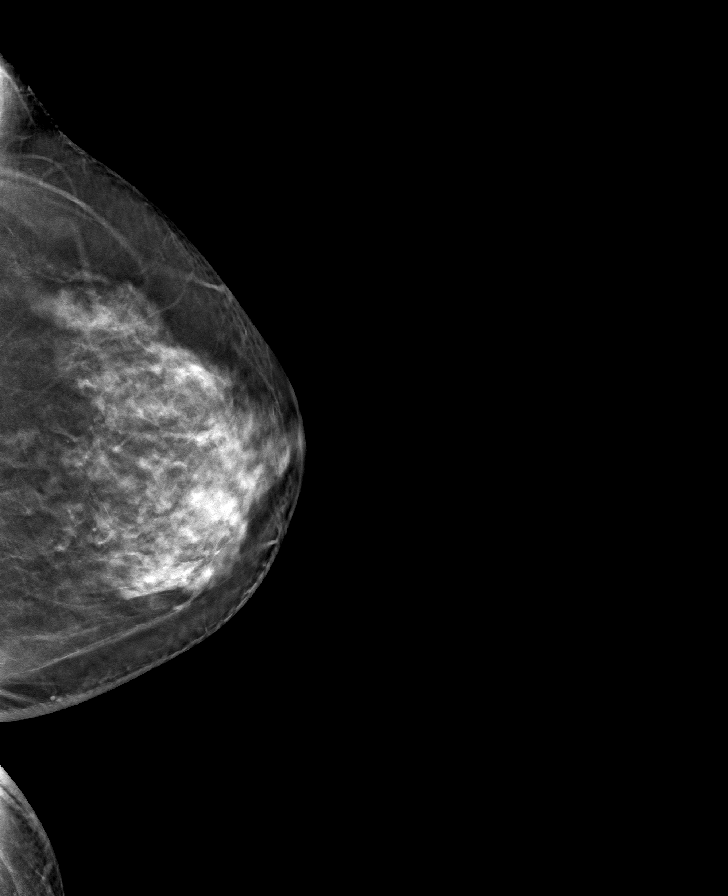

[8 of 24 positions shown; findings below may reference images not displayed]

ACR Breast Density Category c: The breast tissue is heterogeneously
dense, which may obscure small masses.
FINDINGS: There are no findings suspicious for malignancy.
IMPRESSION: No mammographic evidence of malignancy. A result letter of this
screening mammogram will be mailed directly to the patient.

RECOMMENDATION:
Screening mammogram in one year. (Code:Q3-W-BC3)

BI-RADS CATEGORY  1: Negative.

## 2023-11-08 DIAGNOSIS — J309 Allergic rhinitis, unspecified: Secondary | ICD-10-CM | POA: Diagnosis not present

## 2023-11-08 DIAGNOSIS — Z130 Encounter for screening for diseases of the blood and blood-forming organs and certain disorders involving the immune mechanism: Secondary | ICD-10-CM | POA: Diagnosis not present

## 2023-11-08 DIAGNOSIS — G43909 Migraine, unspecified, not intractable, without status migrainosus: Secondary | ICD-10-CM | POA: Diagnosis not present

## 2023-11-08 DIAGNOSIS — Z9181 History of falling: Secondary | ICD-10-CM | POA: Diagnosis not present

## 2023-11-08 DIAGNOSIS — E039 Hypothyroidism, unspecified: Secondary | ICD-10-CM | POA: Diagnosis not present

## 2023-11-08 DIAGNOSIS — K219 Gastro-esophageal reflux disease without esophagitis: Secondary | ICD-10-CM | POA: Diagnosis not present

## 2023-11-08 DIAGNOSIS — Z139 Encounter for screening, unspecified: Secondary | ICD-10-CM | POA: Diagnosis not present

## 2023-11-08 DIAGNOSIS — Z1331 Encounter for screening for depression: Secondary | ICD-10-CM | POA: Diagnosis not present

## 2023-11-08 DIAGNOSIS — Z13220 Encounter for screening for lipoid disorders: Secondary | ICD-10-CM | POA: Diagnosis not present

## 2023-11-08 DIAGNOSIS — Z131 Encounter for screening for diabetes mellitus: Secondary | ICD-10-CM | POA: Diagnosis not present

## 2024-06-06 ENCOUNTER — Ambulatory Visit: Payer: Self-pay | Admitting: Podiatry

## 2024-06-06 DIAGNOSIS — B351 Tinea unguium: Secondary | ICD-10-CM | POA: Diagnosis not present

## 2024-06-06 DIAGNOSIS — L603 Nail dystrophy: Secondary | ICD-10-CM | POA: Diagnosis not present

## 2024-06-06 NOTE — Progress Notes (Unsigned)
 Subjective:  Patient ID: Jocelyn Rose, female    DOB: 1956-08-27,  MRN: [id number]  Jocelyn Rose presents to clinic today for concern of fungal toenails in the bilateral great toes.  She has previously been treated for this by other providers.  She was on 2 rounds of oral terbinafine in the past and has used the topical ciclopirox solution.  She noted mild improvement with all of these treatments.  She stated that these treatments occurred years ago.  She had an injury to the left great toenail where she described a kitty litter box catching on her toenail and lifting the distal portion of the nail and almost ripping the nail off.  She states the nail eventually did fall off and regrew.  She states it grew back.  Thick and discolored ever since then.  She is concerned that she may be getting some early signs of fungus to the right great toenail and wants to make sure this does not happen.  PCP is Hamrick, Charlene CROME, MD.  Past Medical History:  Diagnosis Date   Allergy    Cancer (HCC)    cervical cancer at age - 87   Family history of colon cancer    GERD (gastroesophageal reflux disease)    History of colonic polyps    Hypercholesterolemia    Hypothyroidism    IBS (irritable bowel syndrome)    Migraine headache    Past Surgical History:  Procedure Laterality Date   BREAST BIOPSY Right 01/2007   BREAST BIOPSY Left 02/15/2023   MM LT BREAST BX W LOC DEV 1ST LESION IMAGE BX SPEC STEREO GUIDE 02/15/2023 [facility]   CERVIX SURGERY     COLONOSCOPY  11/19/2015   Colonic polyp status post polypectomy. Mild sogmoid diverticulosis. Moderate internal hemorrhoids.   KNEE SURGERY  2013   lower (uterine) segment Caesarean section (LSCS)     x2 1978 and 1985   No Known Allergies  Review of Systems: Negative except as noted in the HPI.  Objective:  Vascular Examination: Capillary refill time is 3-5 seconds to toes bilateral. Palpable pedal pulses b/l LE. Digital hair present  b/l.    Dermatological Examination: Pedal skin with normal turgor, texture and tone b/l. No open wounds. No interdigital macerations b/l.  The bilateral hallux toenails are 3mm thick, discolored, dystrophic with subungual debris. There is pain with compression of the nail plates.  The left hallux is significantly more involved than the right.  Assessment/Plan: 1. Dermatophytosis of nail    Clippings of the affected toenails were obtained and sent to [facility] laboratory for fungal nail culture.  Informed patient it may take up to 3 weeks to receive the final report.  Will contact the patient to review the results.  We will discuss treatment plan at that time and follow-up.  Discussed topical, oral, and laser nail treatment options with the patient today.  Discussed risks involved as well.  Patient will need to schedule follow-up based on what the follow-up treatment plan involves.  Jocelyn Rose, DPM, FACFAS [facility]     [address] [place] [address]                Office [phone]  Fax [phone]

## 2024-06-13 ENCOUNTER — Ambulatory Visit: Payer: Self-pay | Admitting: Podiatry

## 2024-07-17 DIAGNOSIS — H16012 Central corneal ulcer, left eye: Secondary | ICD-10-CM | POA: Diagnosis not present
